# Patient Record
Sex: Female | Born: 1995 | Race: White | Hispanic: No | Marital: Single | Smoking: Never smoker
Health system: Southern US, Community
[De-identification: ages and names within clinical notes are randomized; demographics above are authoritative.]

## PROBLEM LIST (undated history)

## (undated) DIAGNOSIS — R002 Palpitations: Secondary | ICD-10-CM

## (undated) DIAGNOSIS — M954 Acquired deformity of chest and rib: Secondary | ICD-10-CM

## (undated) DIAGNOSIS — I491 Atrial premature depolarization: Secondary | ICD-10-CM

## (undated) DIAGNOSIS — I493 Ventricular premature depolarization: Secondary | ICD-10-CM

## (undated) DIAGNOSIS — R55 Syncope and collapse: Secondary | ICD-10-CM

## (undated) HISTORY — PX: NO PAST SURGERIES: SHX2092

## (undated) HISTORY — DX: Acquired deformity of chest and rib: M95.4

## (undated) HISTORY — DX: Palpitations: R00.2

## (undated) HISTORY — DX: Atrial premature depolarization: I49.1

## (undated) HISTORY — DX: Ventricular premature depolarization: I49.3

## (undated) HISTORY — DX: Syncope and collapse: R55

---
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
## 2014-02-12 ENCOUNTER — Ambulatory Visit (INDEPENDENT_AMBULATORY_CARE_PROVIDER_SITE_OTHER): Payer: BC Managed Care – PPO | Admitting: Cardiovascular Disease

## 2014-02-12 ENCOUNTER — Encounter: Payer: Self-pay | Admitting: Cardiovascular Disease

## 2014-02-12 ENCOUNTER — Telehealth: Payer: Self-pay

## 2014-02-12 ENCOUNTER — Encounter (INDEPENDENT_AMBULATORY_CARE_PROVIDER_SITE_OTHER): Payer: Self-pay

## 2014-02-12 VITALS — BP 98/78 | HR 74 | Ht 67.0 in | Wt 137.5 lb

## 2014-02-12 DIAGNOSIS — R0602 Shortness of breath: Secondary | ICD-10-CM | POA: Insufficient documentation

## 2014-02-12 DIAGNOSIS — R110 Nausea: Secondary | ICD-10-CM

## 2014-02-12 DIAGNOSIS — I471 Supraventricular tachycardia: Secondary | ICD-10-CM

## 2014-02-12 DIAGNOSIS — R002 Palpitations: Secondary | ICD-10-CM | POA: Insufficient documentation

## 2014-02-12 DIAGNOSIS — I493 Ventricular premature depolarization: Secondary | ICD-10-CM

## 2014-02-12 DIAGNOSIS — Z87898 Personal history of other specified conditions: Secondary | ICD-10-CM

## 2014-02-12 DIAGNOSIS — Z9189 Other specified personal risk factors, not elsewhere classified: Secondary | ICD-10-CM

## 2014-02-12 DIAGNOSIS — I491 Atrial premature depolarization: Secondary | ICD-10-CM

## 2014-02-12 NOTE — Patient Instructions (Addendum)
You are doing well. No medication changes were made.  We will order a 30 day monitor for arrhythmia  Your physician has recommended that you wear an event monitor. Event monitors are medical devices that record the heart's electrical activity. Doctors most often us these monitors to diagnose arrhythmias. Arrhythmias are problems with the speed or rhythm of the heartbeat. The monitor is a small, portable device. You can wear one while you do your normal daily activities. This is usually used to diagnose what is causing palpitations/syncope (passing out).  Please call us if you have new issues that need to be addressed before your next appt.  Your physician wants you to follow-up in: 6 weeks  Your next appointment will be scheduled in our new office located at :  [facility]  [address], [address]  [place], [place] [id number]

## 2014-02-12 NOTE — Assessment & Plan Note (Signed)
Prior Holter monitor at [facility] suggested she had slow atrial tachycardia with rates up to 100. Event monitor has been ordered for further evaluation

## 2014-02-12 NOTE — Assessment & Plan Note (Signed)
Frequent APCs noted on prior Holter monitor. Uncertain if this is what she is appreciating. Repeat event monitor ordered

## 2014-02-12 NOTE — Telephone Encounter (Signed)
Pt father was asking about getting a rx for a BP cuff, for flex spending reasons, also states she has a class at 4:00 today and if she can get it today and take it back with her would be ideal. Please call.

## 2014-02-12 NOTE — Assessment & Plan Note (Signed)
No recent episodes in the past several years, likely secondary to vasovagal syncope or orthostasis. Recommended she stay hydrated, increase her salt intake, try to avoid losing weight

## 2014-02-12 NOTE — Telephone Encounter (Signed)
Rx left at front desk for pt to pick up at her convenience.   Pt's father is aware and states they will stop by after they finish lunch.

## 2014-02-12 NOTE — Assessment & Plan Note (Signed)
Patient and father report irregular rhythm. Prior Holter showing APCs. Symptoms seem to be worse now than they were last October 2014. We have recommended she wear a 30 day event monitor. Symptoms do not happen daily and Holter monitor might miss her arrhythmia.

## 2014-02-12 NOTE — Progress Notes (Signed)
Patient ID: Yolanda Kim, female    DOB: 1995/12/21, 18 y.o.   MRN: [id number]  HPI Comments: Mrs. Yolanda LoudCaroline Kernsis a very pleasant 18 year old woman, sophomore in college, presents for evaluation of palpitations, tachycardia, nausea, shortness of breath. Previously seen by [facility]ology as recently as last year. These notes were attained and thoroughly reviewed. Prior Holter monitor was not available but results were reviewed.  She reports that she had numerous episodes of syncope when she was in high school. This occurred approximately once per year typically in the setting of exertion or being in the heat. Typically blood pressure runs very low. She attributes the syncope 2 drops in her blood pressure and not drinking enough fluids. She's not had syncope in quite some time. She had a Holter for 24 hours back in 02/05/13 which revealed premature atrial contractions that occur as singlets, couplets and runs. The runs consisted of a  atrial  tachycardia with a fastest rate of 100 bpm. The longest run was 8 beats. PAC's occurred 25% of the time.(these details obtained from note from [facility]ology)  More recently started last year she has had worsening episodes of palpitations, tachycardia, sometimes associated with nausea and shortness of breath. Symptoms are worse when drinking alcohol, as well as coffee. She does appreciate palpitations, sometimes strong heartbeat in the evenings when she is trying to sleep. Symptoms last for hours. In the mornings, worse after drinking alcohol the night before, she will wake with malaise, nausea, shortness of breath, palpitations and tachycardia. Typically it will take some time to resolve.  Husband works for the [employer] and has previously helped obtain EKGs and has monitored her heart rate when she has episodes. Heart rate is typically very irregular. Results of prior EKGs are not available   today in [facility] for a return  patient evaluation. She is a 18 y.o. female who was seen in our office 02/05/13 for these complaints. A Holter monitor was placed at that time. This Holter monitor revealed premature atrial contractions and an ectopic atrial rhythm. Yolanda Kim reports that she has palpitations twice a week. She reports that she these occur at rest. She reports elevated heart rates. When asked to indicate a heart rate she indicates a heart rate of 100-110 bpm. She does notice these episodes more when she has had caffeine.  shereports normal exercise tolerance when not having palpitations. She denies chest pain, and cyanosis.   Past Medical History: . Palpitations  . Syncope  . PAC (premature atrial contraction) seen on Holter monitor Atrial tachycardia on Holter monitor   Family Medical History: . Congenital heart disease Neg Hx  . SIDS Neg Hx  . Sudden death Neg Hx  . Arrhythmia, atrial fibrillation Father  . Arrhythmia Paternal Uncle  No coronary artery disease  Social History  Freshman at [employer]. Originally from [place]. Denies smoking. Occasional alcohol  EKG done in the office today shows normal sinus rhythm with rate 74 bpm, no significant ST or T-wave changes     Outpatient Encounter Prescriptions as of 02/12/2014  Medication Sig  . fexofenadine (ALLEGRA) 180 MG tablet Take 180 mg by mouth daily.   . fluticasone (FLONASE) 50 MCG/ACT nasal spray Place 1 spray into the nose daily.     Review of Systems  Constitutional: Negative.   HENT: Negative.   Eyes: Negative.   Respiratory: Positive for shortness of breath.   Cardiovascular: Positive for palpitations.       Tachycardia  Gastrointestinal: Positive  for nausea.  Endocrine: Negative.   Musculoskeletal: Negative.   Skin: Negative.   Allergic/Immunologic: Negative.   Neurological: Negative.   Hematological: Negative.   Psychiatric/Behavioral: Negative.   All other systems reviewed and are negative.   BP 98/78 mmHg  Pulse 74  Ht  5\' 7"  (1.702 m)  Wt 137 lb 8 oz (62.37 kg)  BMI 21.53 kg/m2  Physical Exam  Constitutional: She is oriented to person, place, and time. She appears well-developed and well-nourished.  HENT:  Head: Normocephalic.  Nose: Nose normal.  Mouth/Throat: Oropharynx is clear and moist.  Eyes: Conjunctivae are normal. Pupils are equal, round, and reactive to light.  Neck: Normal range of motion. Neck supple. No JVD present.  Cardiovascular: Normal rate, regular rhythm, S1 normal, S2 normal, normal heart sounds and intact distal pulses.  Exam reveals no gallop and no friction rub.   No murmur heard. Pulmonary/Chest: Effort normal and breath sounds normal. No respiratory distress. She has no wheezes. She has no rales. She exhibits no tenderness.  Abdominal: Soft. Bowel sounds are normal. She exhibits no distension. There is no tenderness.  Musculoskeletal: Normal range of motion. She exhibits no edema or tenderness.  Lymphadenopathy:    She has no cervical adenopathy.  Neurological: She is alert and oriented to person, place, and time. Coordination normal.  Skin: Skin is warm and dry. No rash noted. No erythema.  Psychiatric: She has a normal mood and affect. Her behavior is normal. Judgment and thought content normal.    Assessment and Plan  Nursing note and vitals reviewed.

## 2014-02-12 NOTE — Assessment & Plan Note (Signed)
Etiology is not clear, possibly from tachycardia.event monitor ordered

## 2014-02-12 NOTE — Assessment & Plan Note (Signed)
I suspect her nausea could be secondary to hypotension. She does have baseline low blood pressure, prior syncope. Recommended she buy a blood pressure cuff and closely monitor her blood pressure when she has episodes

## 2014-02-21 ENCOUNTER — Telehealth: Payer: Self-pay | Admitting: Cardiovascular Disease

## 2014-02-21 NOTE — Telephone Encounter (Signed)
Left message for pt's father that they should receive a call from [employer] today.  Asked them to call back if he has any questions or has not heard from them by tomorrow.

## 2014-02-21 NOTE — Telephone Encounter (Signed)
New  Problem   Pt's dad is calling about he hasn't received pt's holter monitor. Please call.

## 2014-02-27 DIAGNOSIS — R002 Palpitations: Secondary | ICD-10-CM

## 2014-03-04 ENCOUNTER — Telehealth: Payer: Self-pay | Admitting: *Deleted

## 2014-03-04 NOTE — Telephone Encounter (Signed)
Yolanda Kim with [employer] monitoring called and stated patient heart rate is 160 Sinus Tach  Sustained 1 minute  They attempted to call patient response  Heart rate was auto detected not activated by patient   Spoke with patient  She stated she was walking to class  She also stated that she did not feel anything abnormal and feels fine now  [employer] to fax strip

## 2014-03-04 NOTE — Telephone Encounter (Signed)
Would see if we can print have the strip to evaluate

## 2014-03-05 NOTE — Telephone Encounter (Signed)
[employer] report printed for Dr. Windell HummingbirdGollan's review.

## 2014-03-19 ENCOUNTER — Telehealth: Payer: Self-pay

## 2014-03-19 NOTE — Telephone Encounter (Signed)
Received call about serious EKG on pt.  Printed report showed HR of 163 at 12/16 pm on 03/18/14. Spoke w/ pt's mother.   She states that she is unaware of pt's activity at that time, but that pt is "in the middle of exams". Asked her to have pt call us back.

## 2014-03-21 NOTE — Telephone Encounter (Signed)
Left message for pt to call back  °

## 2014-03-21 NOTE — Telephone Encounter (Signed)
Would schedule new patient visit with Dr. Graciela HusbandsKlein Rhythm appears to be atrial tachycardia, unable to exclude reentrant rhythm Rates documented up to 170 bpm at rest Unclear if she would benefit from medications versus ablation if a candidate

## 2014-03-21 NOTE — Telephone Encounter (Signed)
[employer] called w/ serious EKG on pt.  HR 173 sustained for 1 min.  Printed report for Dr. Windell HummingbirdGollan's review.

## 2014-03-21 NOTE — Telephone Encounter (Signed)
Spoke w/ pt.  She reports that she was sitting in bed studying when [employer] called to check on her around 11:45. Reports that she was asymptomatic at the time, but felt that her heart was racing at 12:45 while walking to her exam. She denies any other symptoms.

## 2014-03-21 NOTE — Telephone Encounter (Signed)
Received call from [employer] w/ 2nd serious EKG today.  HR 164-167 for full min.  They have left message for pt to call back. Printed reports for Dr. Windell HummingbirdGollan's review.

## 2014-03-22 ENCOUNTER — Telehealth: Payer: Self-pay

## 2014-03-22 NOTE — Telephone Encounter (Signed)
Spoke w/ pt's father. Advised him that pt will not need to wear monitor an additional 30 days, but will wear until 12/18 and keep her appt w/ Dr. Graciela HusbandsKlein in Jan.  He verbalizes understanding and will call back w/ any questions or concerns.

## 2014-03-22 NOTE — Telephone Encounter (Signed)
Spoke w/ pt.  Advised her of Dr. Windell HummingbirdGollan's recommendation.  She is agreeable and is sched to see Dr. Graciela HusbandsKlein 05/10/14 @ 9:00.

## 2014-03-22 NOTE — Telephone Encounter (Signed)
Pt dad called, wanted to know if pt had to wear monitor another 30 days, (until the end of January). Please call and advise

## 2014-03-28 ENCOUNTER — Encounter: Payer: Self-pay | Admitting: *Deleted

## 2014-03-28 NOTE — Telephone Encounter (Signed)
Please call patient's father regarding the holter monitor and when does she need to see Dr. Mariah MillingGollan?

## 2014-03-29 ENCOUNTER — Ambulatory Visit: Payer: BC Managed Care – PPO | Admitting: Cardiovascular Disease

## 2014-03-29 NOTE — Telephone Encounter (Signed)
This encounter was created in error - please disregard.

## 2014-03-29 NOTE — Telephone Encounter (Signed)
Pt is sched to remove 30 day monitor today.  She is sched to see Dr. Graciela HusbandsKlein 05/07/13. Does she need to follow up with you?

## 2014-04-17 ENCOUNTER — Ambulatory Visit (INDEPENDENT_AMBULATORY_CARE_PROVIDER_SITE_OTHER): Payer: Self-pay

## 2014-04-17 ENCOUNTER — Other Ambulatory Visit: Payer: Self-pay

## 2014-04-17 DIAGNOSIS — I471 Supraventricular tachycardia: Secondary | ICD-10-CM

## 2014-04-17 DIAGNOSIS — I491 Atrial premature depolarization: Secondary | ICD-10-CM

## 2014-04-17 DIAGNOSIS — R002 Palpitations: Secondary | ICD-10-CM

## 2014-04-17 DIAGNOSIS — R0602 Shortness of breath: Secondary | ICD-10-CM

## 2014-04-17 DIAGNOSIS — R110 Nausea: Secondary | ICD-10-CM

## 2014-05-07 ENCOUNTER — Ambulatory Visit: Payer: BC Managed Care – PPO | Admitting: Internal Medicine

## 2014-06-21 ENCOUNTER — Encounter: Payer: Self-pay | Admitting: Internal Medicine

## 2014-06-21 ENCOUNTER — Ambulatory Visit (INDEPENDENT_AMBULATORY_CARE_PROVIDER_SITE_OTHER): Payer: BLUE CROSS/BLUE SHIELD | Admitting: Internal Medicine

## 2014-06-21 VITALS — BP 111/68 | HR 68 | Ht 67.0 in | Wt 137.0 lb

## 2014-06-21 DIAGNOSIS — I471 Supraventricular tachycardia: Secondary | ICD-10-CM

## 2014-06-21 NOTE — Progress Notes (Signed)
ELECTROPHYSIOLOGY CONSULT NOTE  Patient ID: Yolanda Kim, MRN: [id number], DOB/AGE: Sep 05, 1995 19 y.o. Admit date: (Not on file) Date of Consult: 06/21/2014  Primary Physician: Chrys Racer, MD Primary Cardiologist: TG  Chief Complaint: syncope and palpitations   HPI Yolanda Kim is a 19 y.o. female  sophomore at [employer] was referred because of an abnormal event recorder.  She was a Horticulturist, commercial in high school and had episodes of recurrent stereo typical syncope following exertion particularly when she exposed to high ambient temperature. There is a stereotypical prodrome characterized by tinnitus, blurry vision, some diaphoresis and some nausea with residual orthostatic intolerance and considerable fatigue. She has had no episodes in the last 2 years. Over the last 18 months however, she has had a different type of spell characterized by significant nausea accompanied also by lightheadedness and recognizable pallor from her father who is a IT sales professional. We don't have vital sign information from this. It is also associated with profound residual fatigue. In her mindThese episodes are somewhat similar.  She does not exercise because of symptoms of lightheadedness and weakness. She does not have these symptoms so much during her activities of daily living. She denies shower intolerance. 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  Her diet is replete of fluid but deplete of sodium  She had an abnormal Holter somewhere along the line and was seen by [facility] cardiology was concerned about her PACs. She has not had an echocardiogram. These notes were reviewed     Past Medical History  Diagnosis Date  . Syncope and collapse   . PVC's (premature ventricular contractions)   . 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 PAC (premature atrial contraction)   . Palpitations   . Vasovagal syncope       Surgical History: History reviewed. No pertinent past  surgical history.   Home Meds: Prior to Admission medications   Medication Sig Start Date End Date Taking? Authorizing Provider  fexofenadine (ALLEGRA) 180 MG tablet Take 180 mg by mouth daily.    Yes Historical Provider, MD  fluticasone (FLONASE) 50 MCG/ACT nasal spray Place 1 spray into the nose daily.    Yes Historical Provider, MD      Allergies:  Allergies  Allergen Reactions  . Cephalosporins Other (See Comments)    History   Social History  . Marital Status: Single    Spouse Name: N/A  . Number of Children: N/A  . Years of Education: N/A   Occupational History  . Not on file.   Social History Main Topics  . Smoking status: Never Smoker   . Smokeless tobacco: Not on file  . Alcohol Use: Yes     Comment: socially.  . Drug Use: No  . Sexual Activity: Not on file   Other Topics Concern  . Not on file   Social History Narrative     Family History  Problem Relation Age of Onset  . Arrhythmia Father     A-Fib  . Hypertension Father   . Hyperlipidemia Father      ROS:  Please see the history of present illness.     All other systems reviewed and negative.    Physical Exam:   Blood pressure 111/68, pulse 68, height  (1.702 m), weight 137 lb (62.143 kg). General: Well developed, well nourished female in no acute distress. Head: Normocephalic, atraumatic, sclera non-icteric, no xanthomas, nares are without discharge. EENT: normal Lymph  Nodes:  none Back: without scoliosis/kyphosis, no CVA tendersness Neck: Negative for carotid bruits. JVD not elevated. Lungs: Clear bilaterally to auscultation without wheezes, rales, or rhonchi. Breathing is unlabored. Heart: RRR with S1 S2. No murmur , rubs, or gallops appreciated. Abdomen: Soft, non-tender, non-distended with normoactive bowel sounds. No hepatomegaly. No rebound/guarding. No obvious abdominal masses. Msk:  Strength and tone appear normal for age. Extremities: No clubbing or cyanosis. No edema.  Distal  pedal pulses are 2+ and equal bilaterally. Skin: Warm and Dry Neuro: Alert and oriented X 3. CN III-XII intact Grossly normal sensory and motor function . Psych:  Responds to questions appropriately with a normal affect.      Labs: Cardiac Enzymes No results for input(s): CKTOTAL, CKMB, TROPONINI in the last 72 hours. CBC No results found for: WBC, HGB, HCT, MCV, PLT PROTIME: No results for input(s): LABPROT, INR in the last 72 hours. Chemistry No results for input(s): NA, K, CL, CO2, BUN, CREATININE, CALCIUM, PROT, BILITOT, ALKPHOS, ALT, AST, GLUCOSE in the last 168 hours.  Invalid input(s): LABALBU Lipids No results found for: CHOL, HDL, LDLCALC, TRIG BNP No results found for: PROBNP Miscellaneous No results found for: DDIMER  Radiology/Studies:  No results found.  EKG:  Sinus rhythm at 68 Intervals 01/17/37  Event recorder was reviewed and demonstrated sinus tachycardia   Assessment and Plan:   Dysautonomia  She has had a long-standing history of syncope occurring in the post exercise phase particularly with high ambient temperatures consistent with neurally mediated syncope. The more recent episodes are less clearly that have epiphenomena to suggest that in fact his dysautonomic issue as suggested by the fact that they are stimulated by caffeine, aggravated by ambient heat. Associated with some degree of exercise intolerance and are associated with pallor as well as nausea. The fact that they are somewhat atypical give me some cause. She also has evidence of orthostatic hypotension as demonstrated by her heart rate rise of 30+ beats today. This again supports the diagnosis.  We have discussed extensively the physiology of dysautonomia. I have given her the NDRF.org website. I've advised her to increase her salt replacement with the use of ThermaTabs 2 tablets twice daily. We will review the situation in about 3 months   Sherryl MangesSteven Vern Guerette

## 2014-06-21 NOTE — Patient Instructions (Signed)
Your physician recommends that you continue on your current medications as directed. Please refer to the Current Medication list given to you today.  Your physician has requested that you have an echocardiogram in [place]. Echocardiography is a painless test that uses sound waves to create images of your heart. It provides your doctor with information about the size and shape of your heart and how well your heart's chambers and valves are working. This procedure takes approximately one hour. There are no restrictions for this procedure.  Your physician recommends that you schedule a follow-up appointment in: 4 months with Dr. Graciela HusbandsKlein in [place]

## 2014-06-24 IMAGING — US TRANSABDOMINAL ULTRASOUND OF PELVIS
1 series · 14 of 25 positions shown · non-contrast
Comparison: none

REASON FOR EXAM: CALL REPORT   [DATE]  No transvaginal Pelvic Pain RUQ
abd pain Eval Appendix
COMMENTS:

[Series 1: transabdominal ultrasound of pelvis · 0.18mm/px · 14 of 45 slices shown]
[im 1/45]
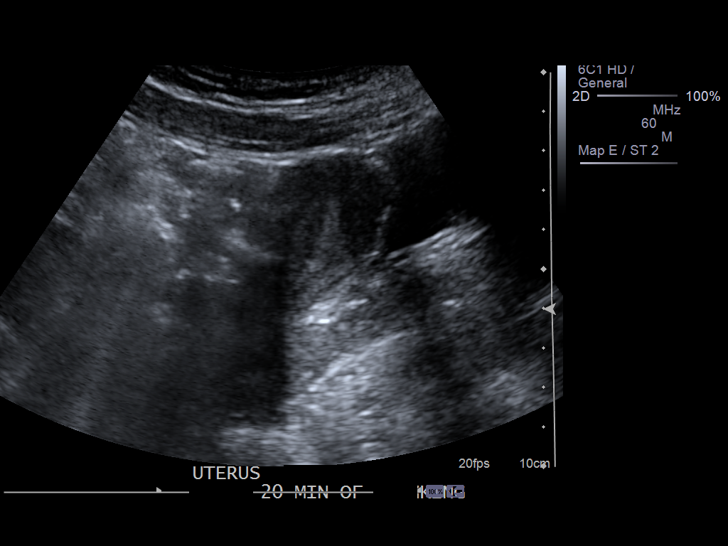
[im 4/45]
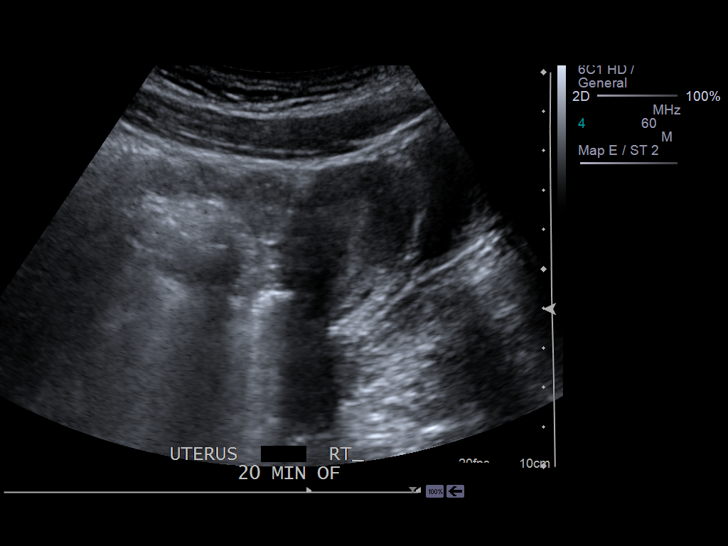
[im 8/45]
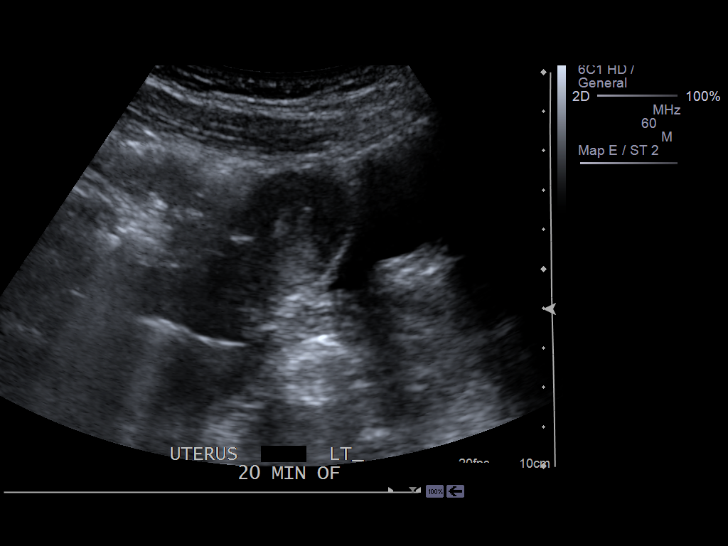
[im 12/45]
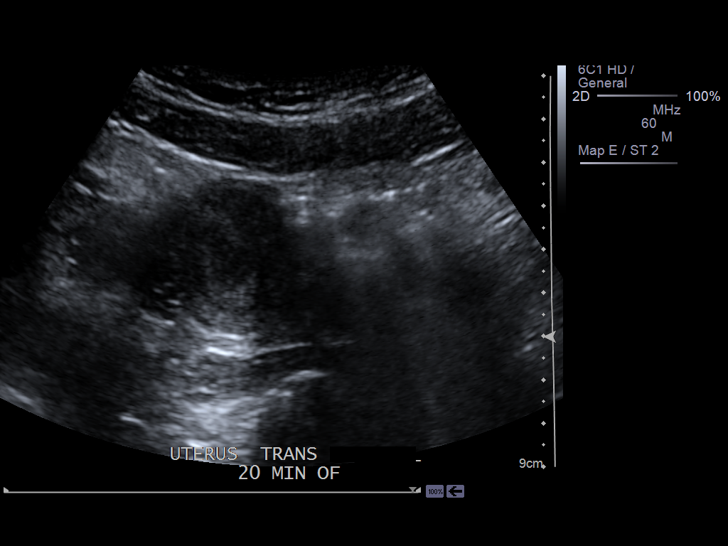
[im 15/45]
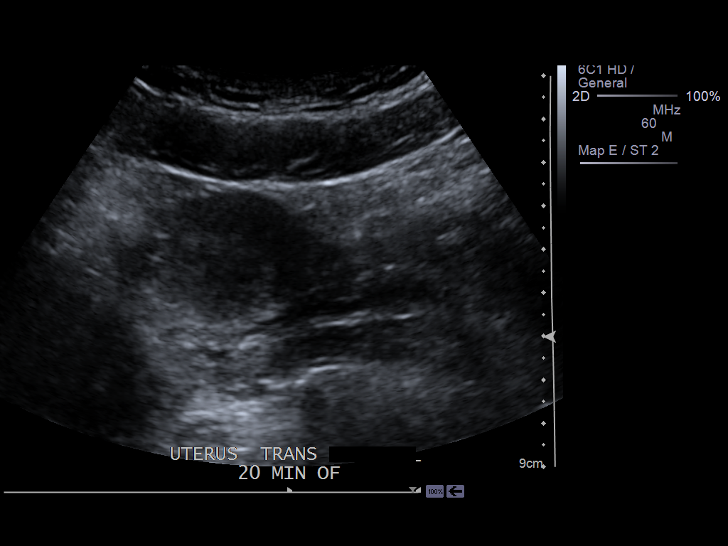
[im 17/45]
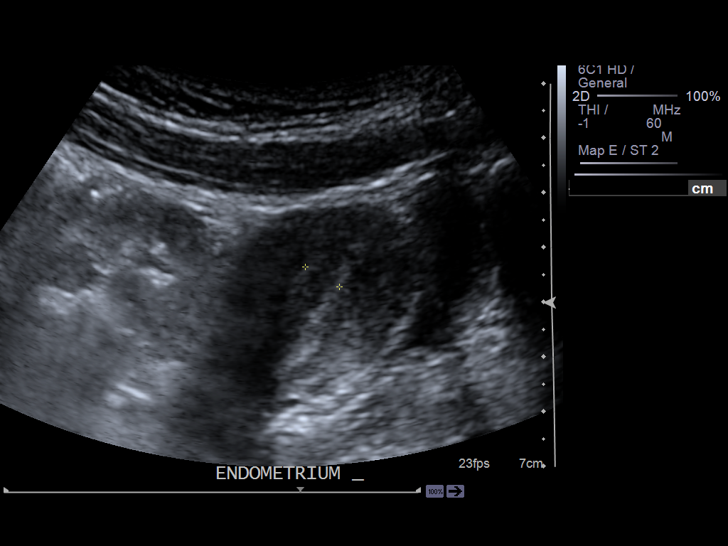
[im 21/45]
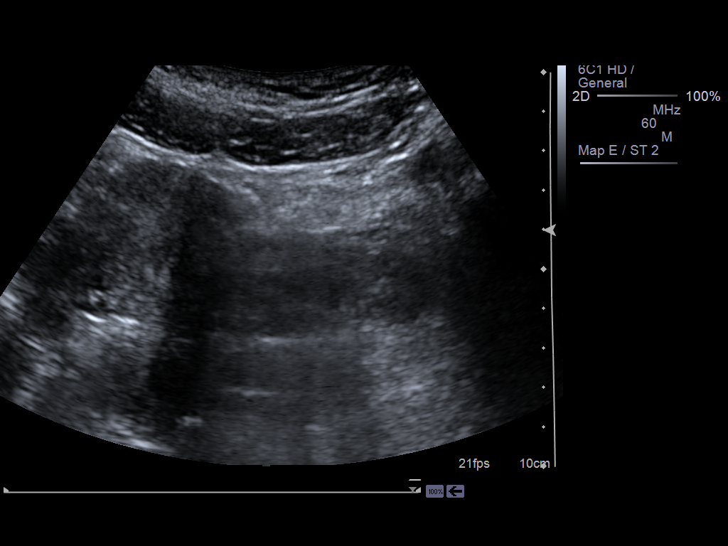
[im 24/45]
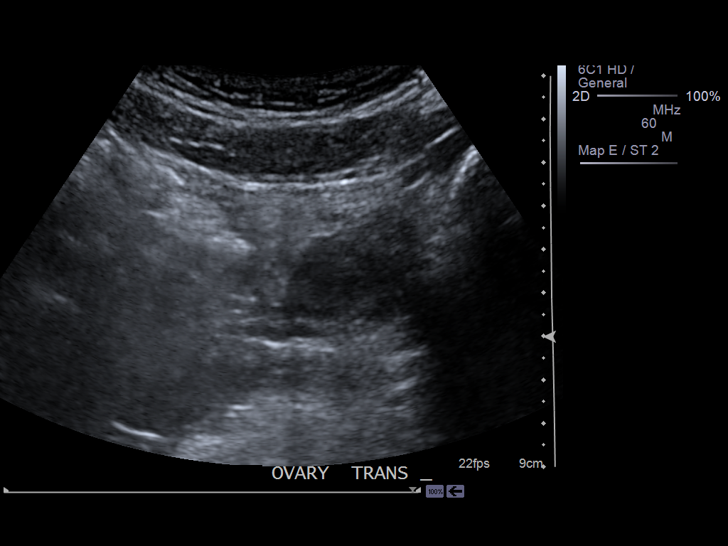
[im 28/45]
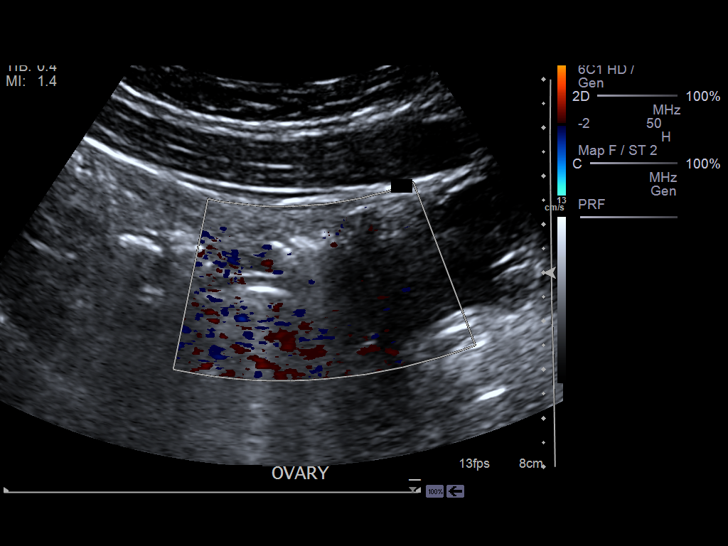
[im 30/45]
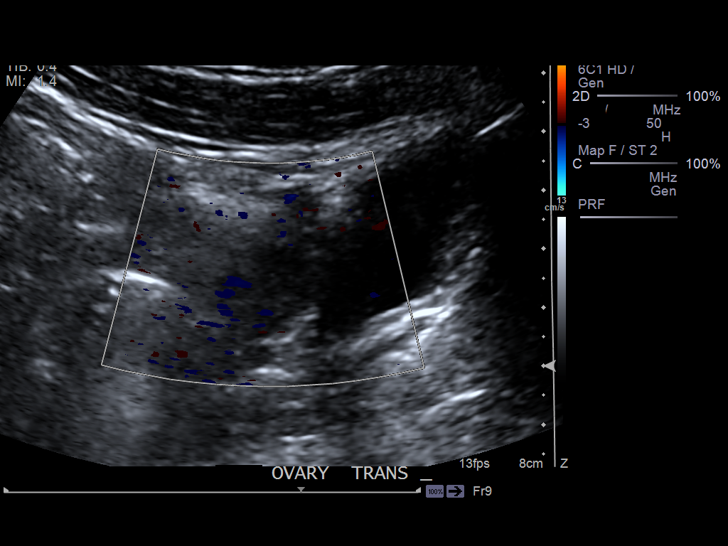
[im 34/45]
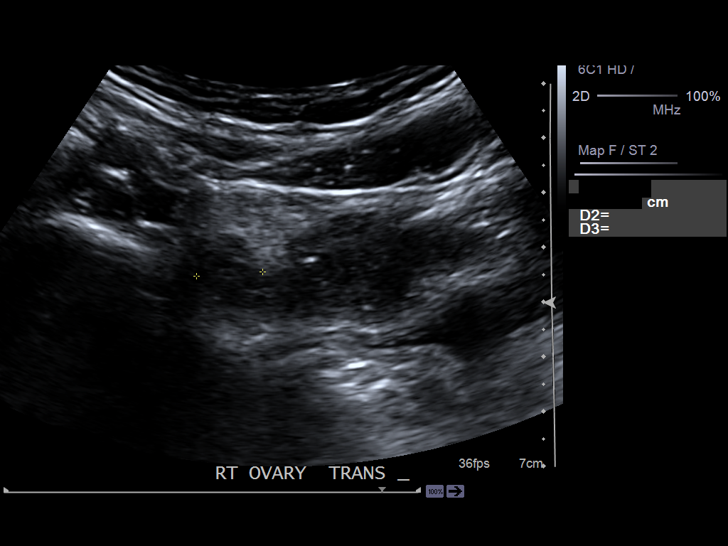
[im 37/45]
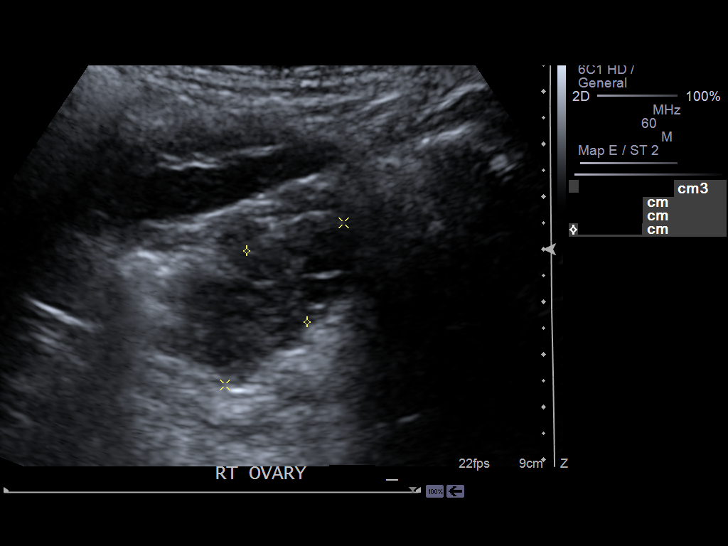
[im 41/45]
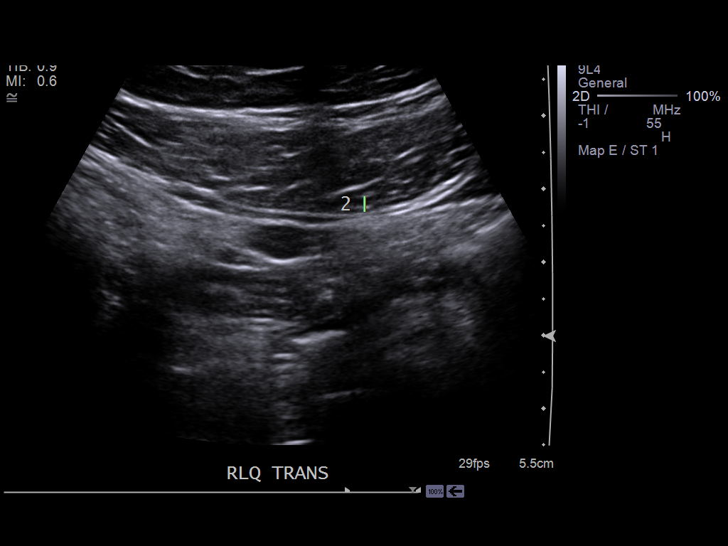
[im 45/45]
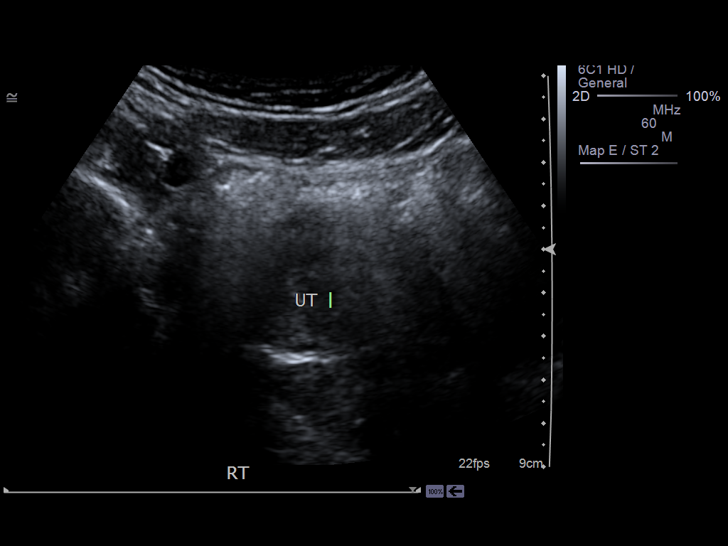

[14 of 25 positions shown; findings below may reference images not displayed]

PROCEDURE:     KLPIGBB - KLPIGBB PELVIS NON-OB  - July 19, 2012 [DATE]

RESULT:     The pelvis was evaluated with trans- abdominal imaging
techniques only.

The uterus is normal in echotexture and measures 6.7 x 4 x 2.6 cm. The
endometrial stripe measures just under 8 millimeters in thickness. The left
ovary measures 1.9 x 2.0 x 1.7 centimeters. The right ovary measures 1.2 x
3.8 x 1.8 cm. Vascularity of the ovaries is normal.
IMPRESSION: Normal pelvic ultrasound examination.

[REDACTED]

## 2014-06-24 IMAGING — US US ABDOMEN LIMITED SLG ORGAN/ASCITES
1 series · 13 of 13 positions shown · non-contrast
Comparison: none

REASON FOR EXAM: CALL REPORT   [DATE]  No transvaginal Pelvic Pain RUQ
abd pain Eval Appendix
COMMENTS:

[Series 1: us abdomen limited slg organ/ascites · 0.11mm/px · 13 of 13 slices shown]
[im 1/13]
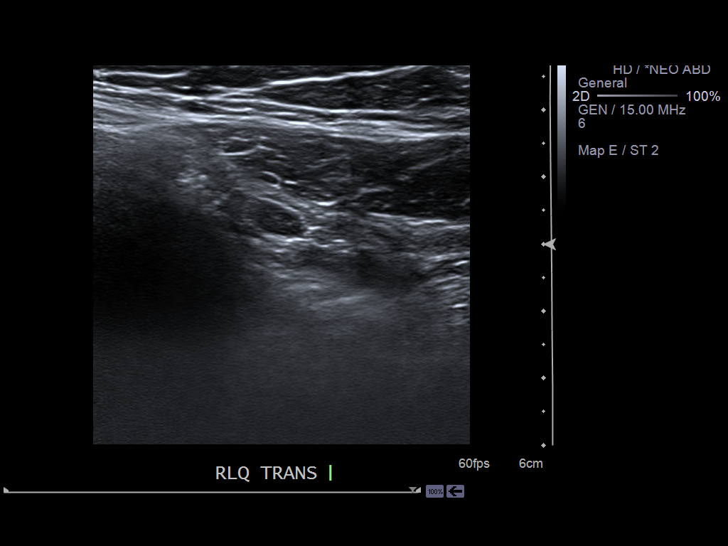
[im 2/13]
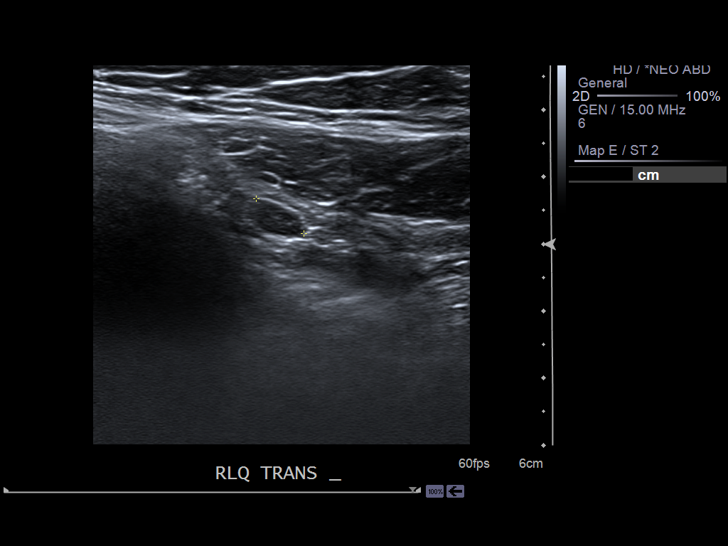
[im 3/13]
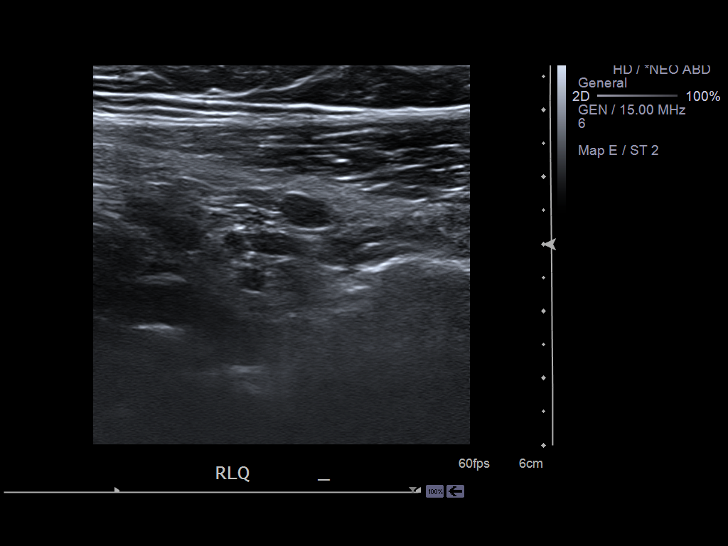
[im 4/13]
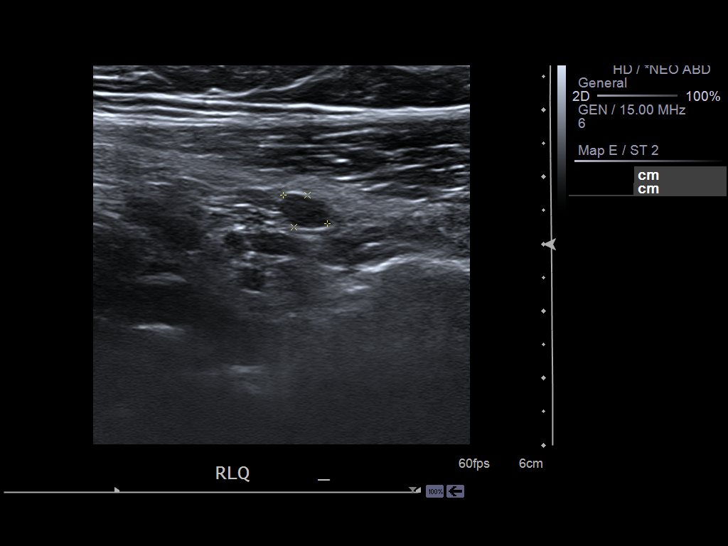
[im 5/13]
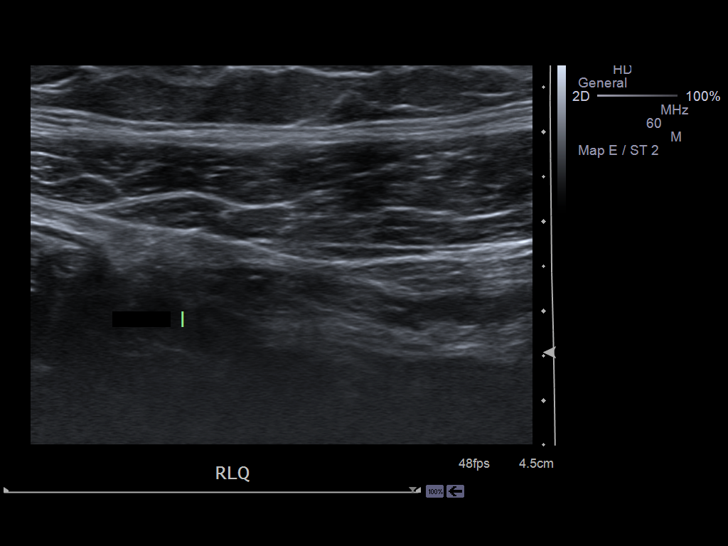
[im 6/13]
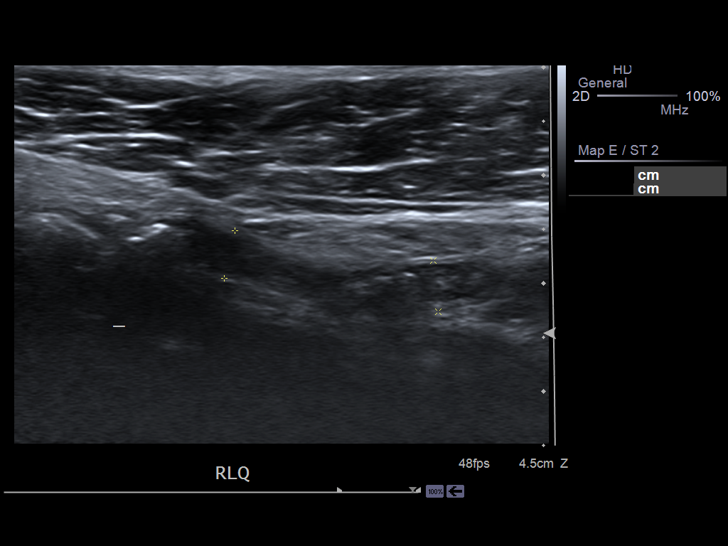
[im 7/13]
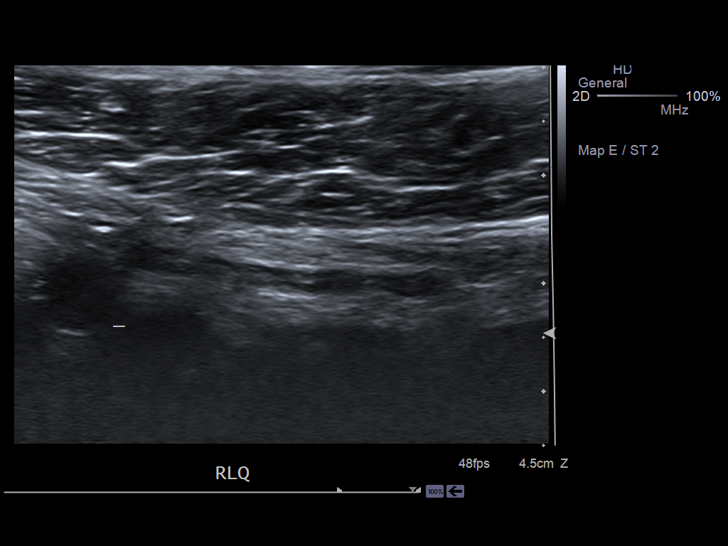
[im 8/13]
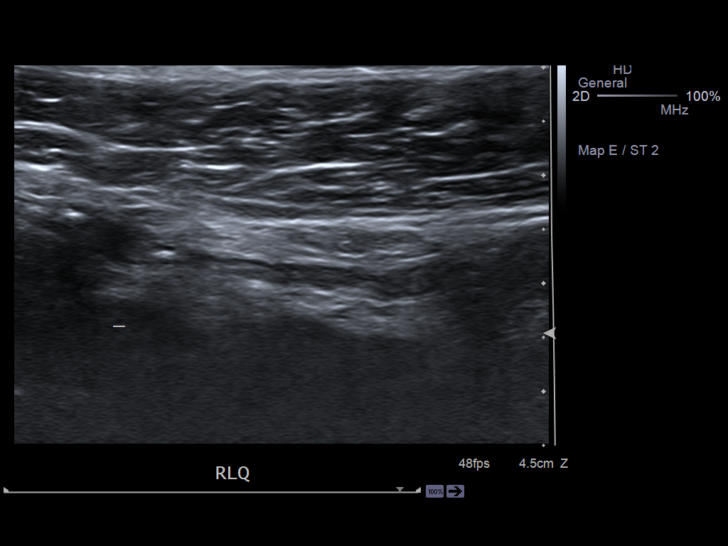
[im 9/13]
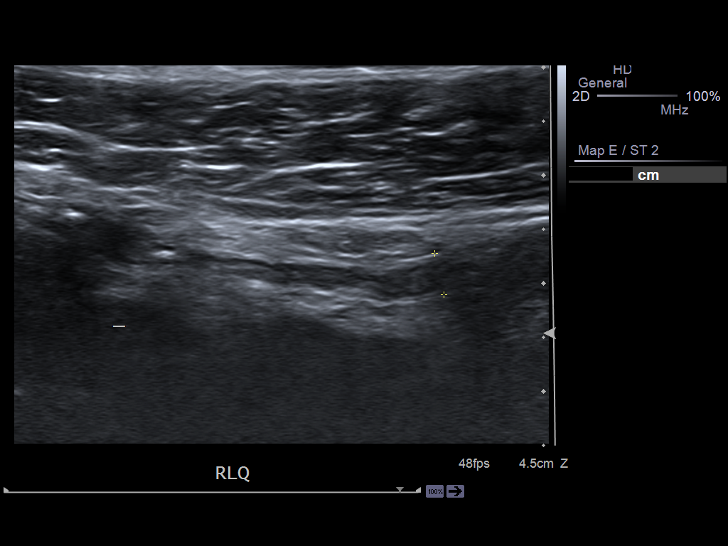
[im 10/13]
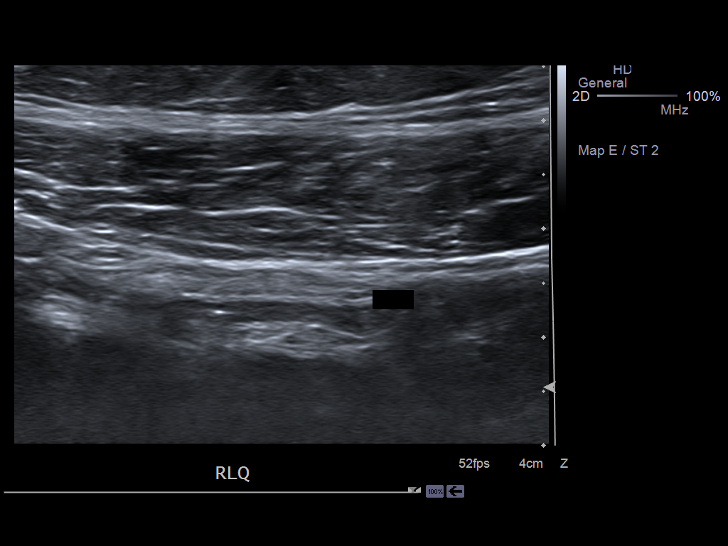
[im 11/13]
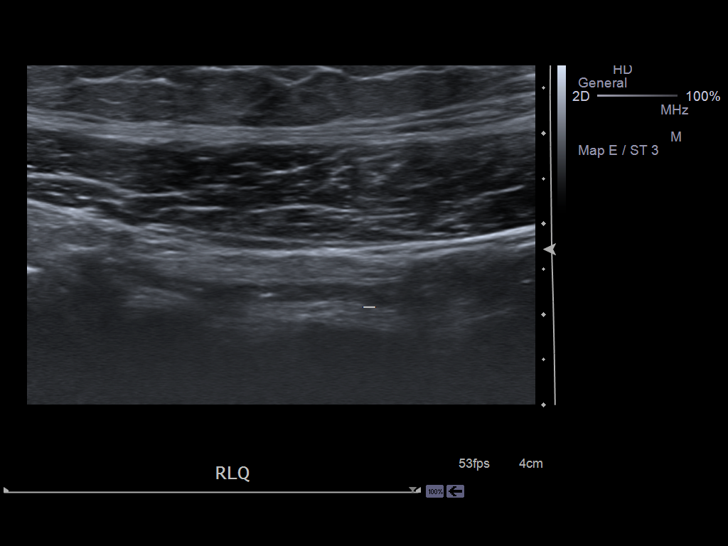
[im 12/13]
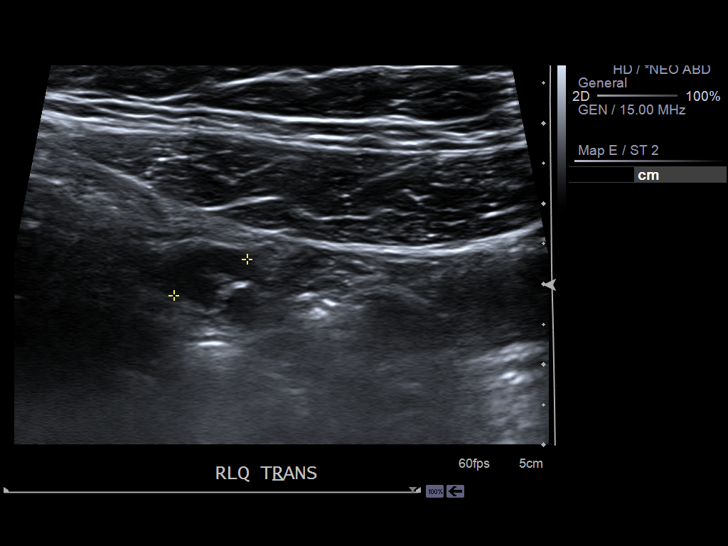
[im 13/13]
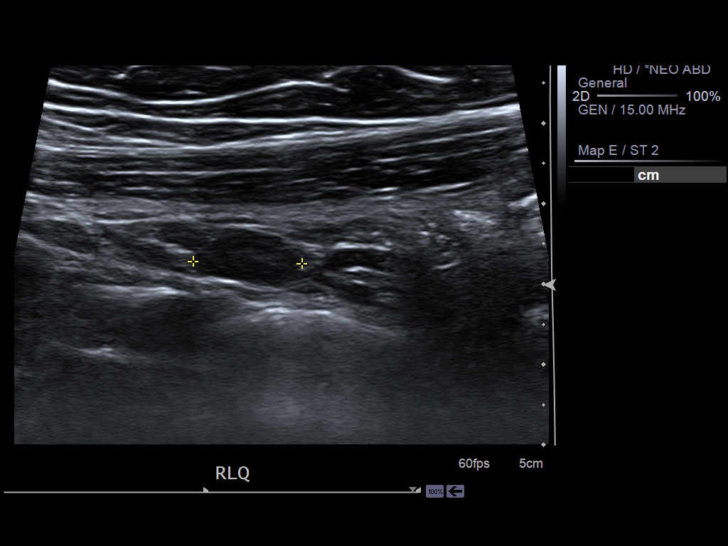

[13 of 13 positions shown; findings below may reference images not displayed]

PROCEDURE:     BOKOLO - BOKOLO ABDOMEN LTD 1 ORGAN OR QUAD  - July 19, 2012 [DATE]

RESULT:     Doppler and compression graded ultrasound was performed of the
right lower quadrant of the abdomen. No discrete normal or abnormal appendix
is demonstrated. There are mildly prominent lymph nodes present here.
IMPRESSION: No discrete normal or abnormal appendix is demonstrated. A
few lymph nodes are noted. If there are persistent clinical concerns of
acute appendicitis, CT scanning would be the most useful next step.

[REDACTED]

## 2014-06-24 IMAGING — CT CT ABD-PELV W/ CM
1 of 2 series · 15 of 32 positions shown, 19 images · IV contrast (isovue)
Comparison: none

REASON FOR EXAM: CALL REPORT   [DATE]  No transvaginal Pelvic Pain RUQ
abd pain Eval Appendix
COMMENTS:

PROCEDURE:     CT  - CT ABDOMEN / PELVIS  W  - July 19, 2012  [DATE]
RESULT:     Comparison:  None
TECHNIQUE: Multiple axial images of the abdomen and pelvis were performed
from the lung bases to the pubic symphysis, with p.o. contrast and with 85
mL of Isovue 300 intravenous contrast.

[Series 2: 3mm soft tissue · axial · 0.62mm/px · z∈[-474,-60]mm · 15 of 151 slices shown, 19 images]
[im 7/151  soft-tissue]
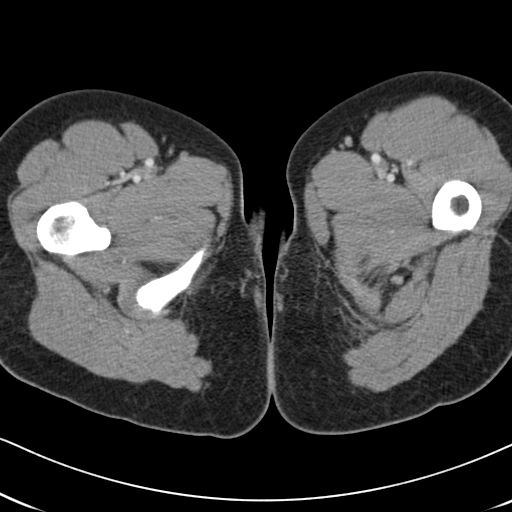
[im 7/151  bone]
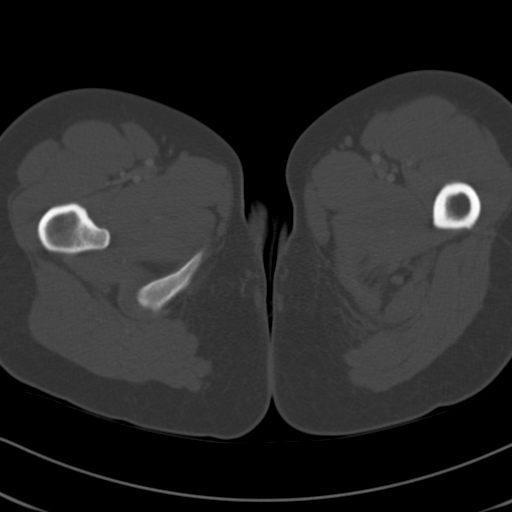
[im 19/151  soft-tissue]
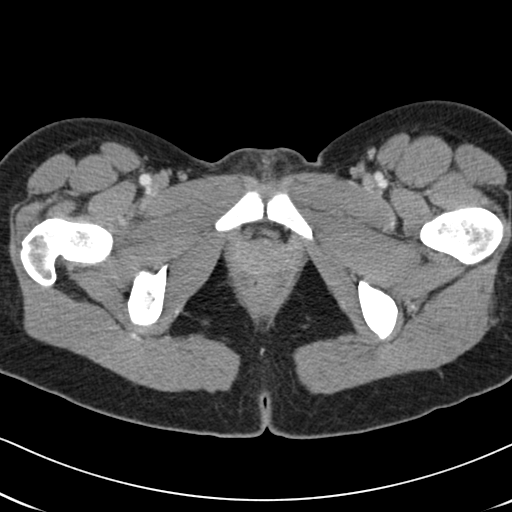
[im 31/151  soft-tissue]
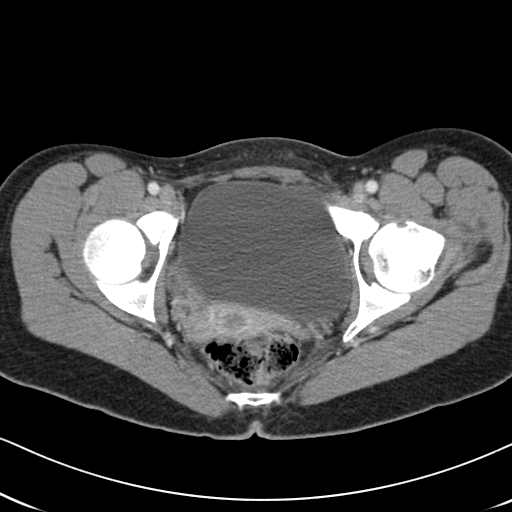
[im 43/151  soft-tissue]
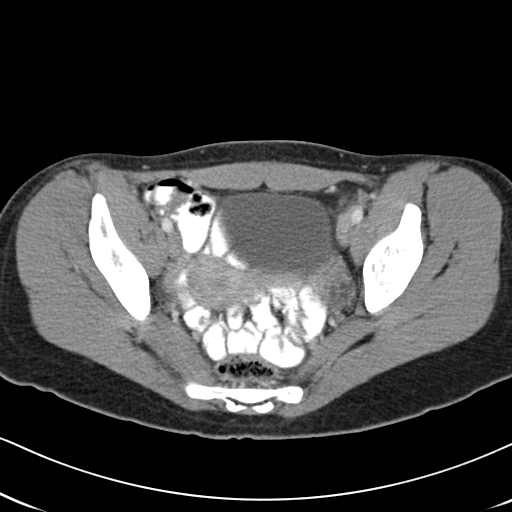
[im 55/151  soft-tissue]
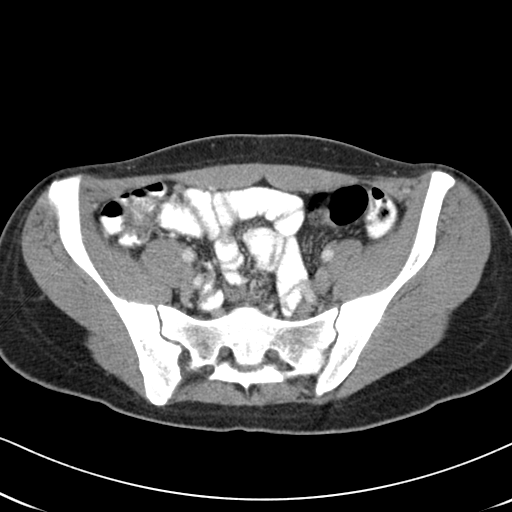
[im 67/151  soft-tissue]
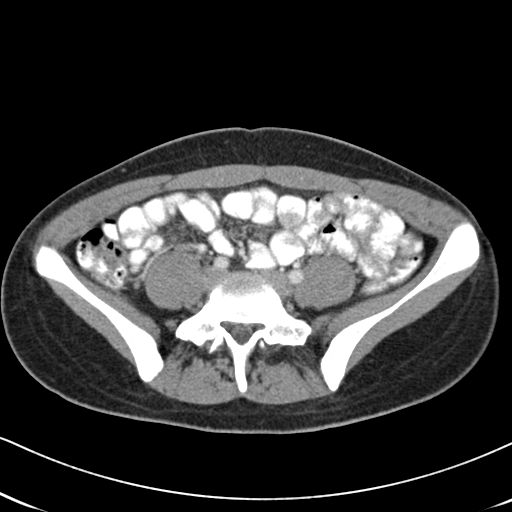
[im 79/151  soft-tissue]
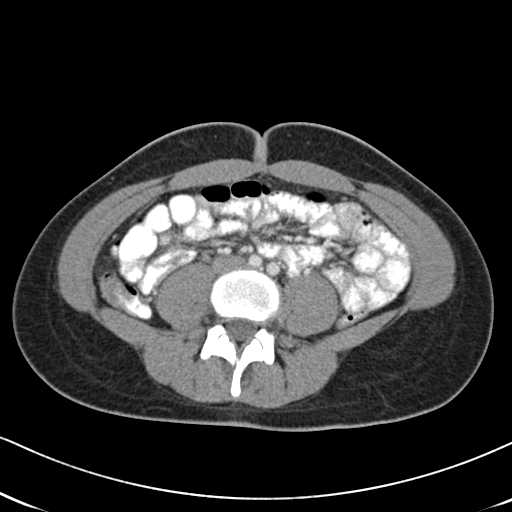
[im 85/151  soft-tissue]
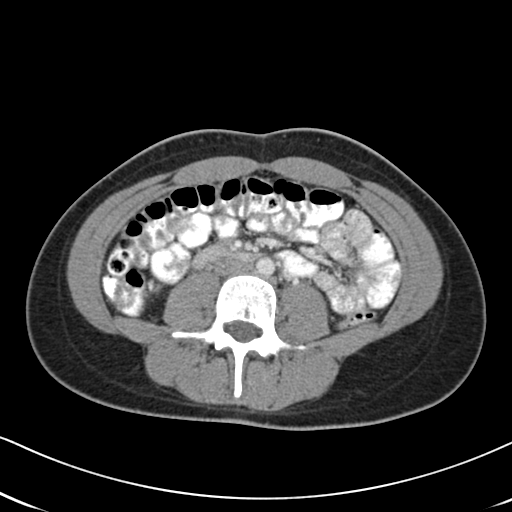
[im 97/151  soft-tissue]
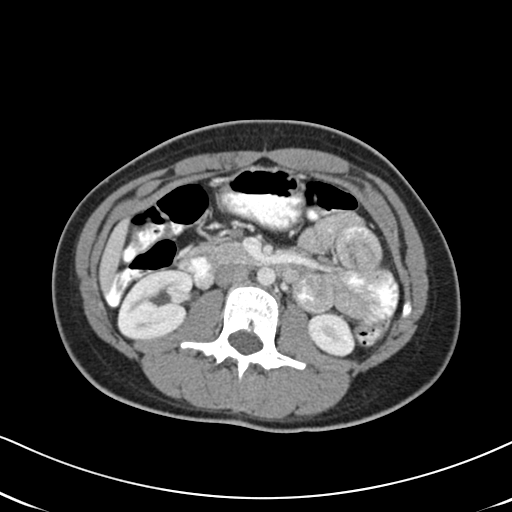
[im 97/151  bone]
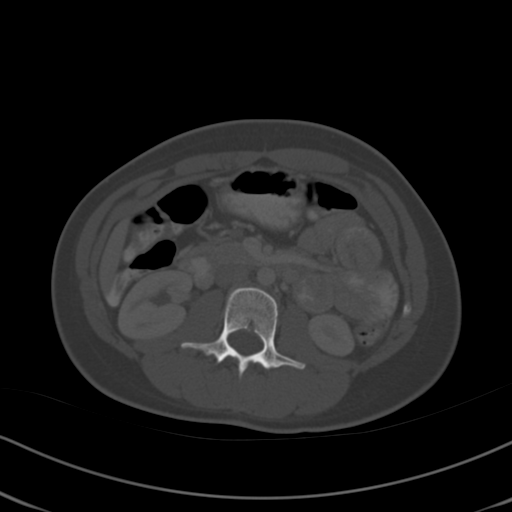
[im 109/151  soft-tissue]
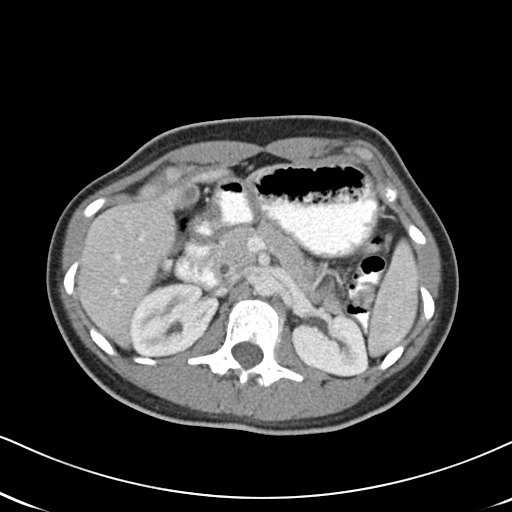
[im 121/151  soft-tissue]
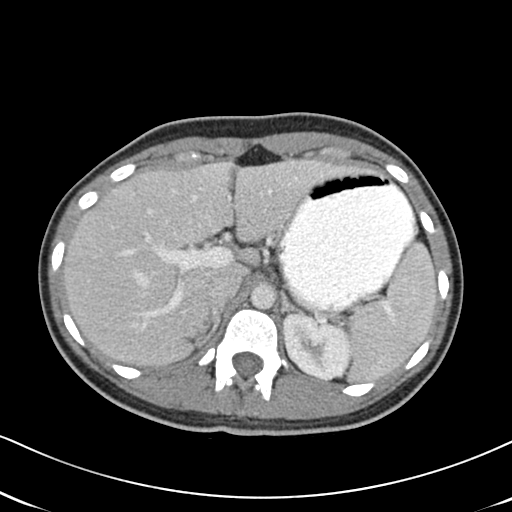
[im 127/151  lung]
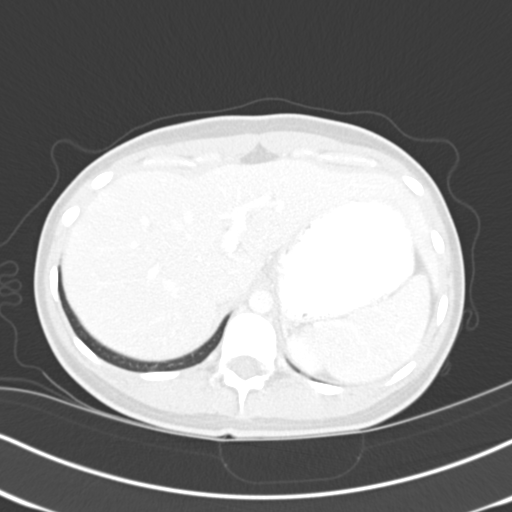
[im 133/151  soft-tissue]
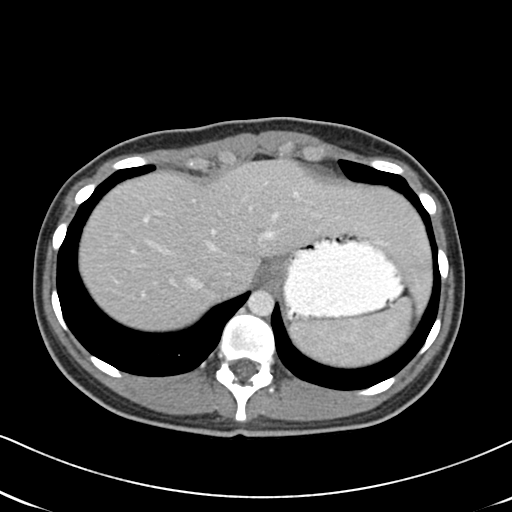
[im 133/151  lung]
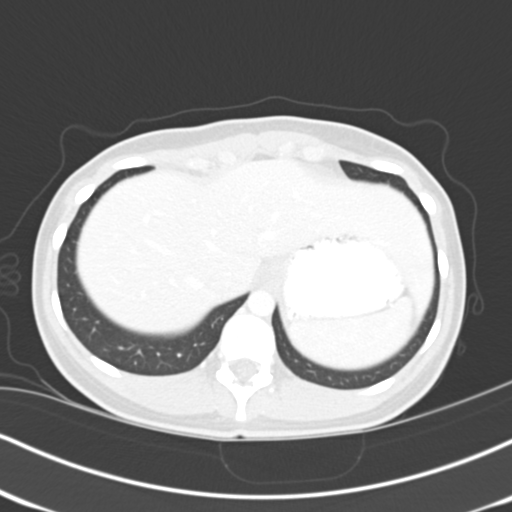
[im 139/151  lung]
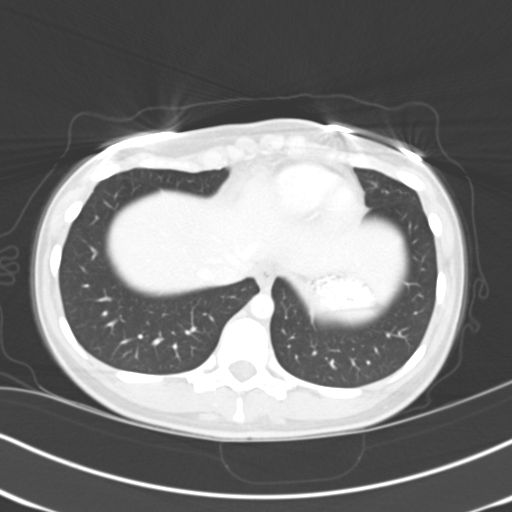
[im 145/151  soft-tissue]
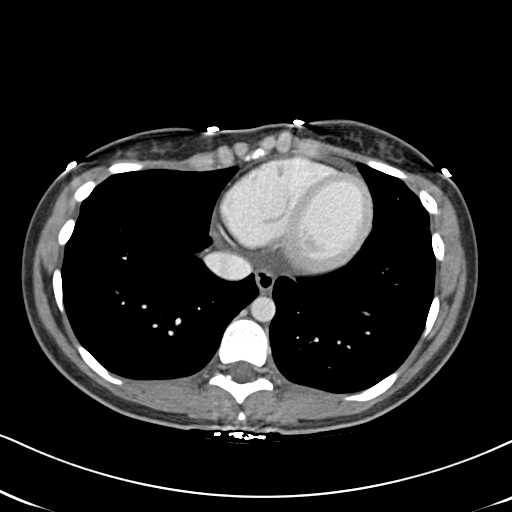
[im 145/151  lung]
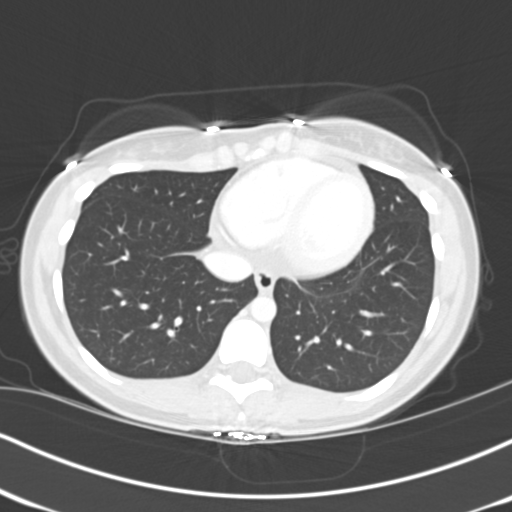

[15 of 32 positions shown; findings below may reference images not displayed]

FINDINGS: The liver, gallbladder, spleen, adrenals, and pancreas are unremarkable. The
kidneys enhance normally. The appendix is normal. There is a trace amount of
free fluid in the pelvis, which is likely physiologic. The small and large
bowel are normal in caliber. There is a small, short segment small bowel
small bowel intussusception in the left hemi-abdomen. No evidence of bowel
junction. No associated mass identified.

No aggressive lytic or sclerotic osseous lesions are identified.
IMPRESSION: 1. Normal appendix.
2. There is a short segment small bowel small bowel intussusception in the
left hemiabdomen, without evidence of obstruction. This can be an incidental
finding. Clinical correlation is recommended.

## 2014-06-28 ENCOUNTER — Ambulatory Visit (HOSPITAL_COMMUNITY): Payer: BLUE CROSS/BLUE SHIELD | Attending: Cardiology | Admitting: Cardiology

## 2014-06-28 DIAGNOSIS — I471 Supraventricular tachycardia: Secondary | ICD-10-CM | POA: Diagnosis not present

## 2014-06-28 NOTE — Progress Notes (Signed)
Echo performed. 

## 2017-08-30 ENCOUNTER — Ambulatory Visit: Payer: Self-pay | Admitting: Obstetrics and Gynecology

## 2017-09-09 ENCOUNTER — Ambulatory Visit: Payer: Self-pay | Admitting: Obstetrics and Gynecology

## 2017-09-14 ENCOUNTER — Ambulatory Visit (INDEPENDENT_AMBULATORY_CARE_PROVIDER_SITE_OTHER): Payer: 59 | Admitting: Obstetrics and Gynecology

## 2017-09-14 ENCOUNTER — Other Ambulatory Visit: Payer: Self-pay

## 2017-09-14 VITALS — BP 100/60 | HR 70 | Ht 67.0 in | Wt 125.0 lb

## 2017-09-14 DIAGNOSIS — Z30011 Encounter for initial prescription of contraceptive pills: Secondary | ICD-10-CM

## 2017-09-14 DIAGNOSIS — Z124 Encounter for screening for malignant neoplasm of cervix: Secondary | ICD-10-CM

## 2017-09-14 DIAGNOSIS — Z113 Encounter for screening for infections with a predominantly sexual mode of transmission: Secondary | ICD-10-CM

## 2017-09-14 DIAGNOSIS — Z01419 Encounter for gynecological examination (general) (routine) without abnormal findings: Secondary | ICD-10-CM | POA: Diagnosis not present

## 2017-09-14 DIAGNOSIS — Z1339 Encounter for screening examination for other mental health and behavioral disorders: Secondary | ICD-10-CM

## 2017-09-14 DIAGNOSIS — Z0000 Encounter for general adult medical examination without abnormal findings: Secondary | ICD-10-CM

## 2017-09-14 DIAGNOSIS — Z1331 Encounter for screening for depression: Secondary | ICD-10-CM

## 2017-09-14 LAB — HM PAP SMEAR: HM Pap smear: NORMAL

## 2017-09-14 MED ORDER — NORGESTIMATE-ETH ESTRADIOL 0.25-35 MG-MCG PO TABS: 1.0000 | ORAL_TABLET | Freq: Every day | ORAL | 4 refills | Status: DC

## 2017-09-14 NOTE — Progress Notes (Signed)
Gynecology Annual Exam  PCP: Chrys Racer, MD  Chief Complaint  Patient presents with  . Gynecologic Exam    No complaints    History of Present Illness:  Ms. Yolanda Kim is a 22 y.o. G0P0000 who LMP was Patient's last menstrual period was 09/11/2017., presents today for her annual examination.  Her menses are regular every 28-30 days, lasting 5 day(s).  Dysmenorrhea none. She does not have intermenstrual bleeding.  She is not sexually active.  Last Pap: never had  Hx of STDs: none  There is a FH of breast cancer in her MGM. There is no FH of ovarian cancer. The patient does do self-breast exams.  Tobacco use: The patient denies current or previous tobacco use. Alcohol use: social drinker Exercise: moderately active  The patient wears seatbelts: yes.   The patient reports that domestic violence in her life is absent.   Patient is a 22 y.o. G0P0000 presenting for contraception consult.  She is currently on nothing for contraception and desiring to start OCP (estrogen/progesterone).  She has a past medical history significant for no contraindication to estrogen.  She specifically denies a history of migraine with aura, chronic hypertension, history of DVT/PE and smoking.  Reported Patient's last menstrual period was 09/11/2017.Marland Kitchen    Past Medical History:  Diagnosis Date  . 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 PAC (premature atrial contraction)   . Palpitations   . PVC's (premature ventricular contractions)   . Syncope and collapse   . Vasovagal syncope    Past Surgical History:  Procedure Laterality Date  . NO PAST SURGERIES     Medications   Medication Sig Start Date End Date Taking? Authorizing Provider  fexofenadine (ALLEGRA) 180 MG tablet Take 180 mg by mouth daily.    Yes [provider]  fluticasone (FLONASE) 50 MCG/ACT nasal spray Place 1 spray into the nose daily.    Yes [provider]   Allergies  Allergen Reactions  .  Cephalosporins Other (See Comments)   Obstetric History: G0P0000  Social History   Socioeconomic History  . Marital status: Single    Spouse name: Not on file  . Number of children: Not on file  . Years of education: Not on file  . Highest education level: Not on file  Occupational History  . Not on file  Social Needs  . Financial resource strain: Not on file  . Food insecurity:    Worry: Not on file    Inability: Not on file  . Transportation needs:    Medical: Not on file    Non-medical: Not on file  Tobacco Use  . Smoking status: Never Smoker  . Smokeless tobacco: Never Used  Substance and Sexual Activity  . Alcohol use: Yes    Alcohol/week: 1.2 oz    Types: 2 Glasses of wine per week    Comment: socially.  . Drug use: No  . Sexual activity: Never    Birth control/protection: None  Lifestyle  . Physical activity:    Days per week: 2 days    Minutes per session: 50 min  . Stress: Not on file  Relationships  . Social connections:    Talks on phone: Not on file    Gets together: Not on file    Attends religious service: Not on file    Active member of club or organization: Not on file    Attends meetings of clubs or organizations: Not on file  Relationship status: Not on file  . Intimate partner violence:    Fear of current or ex partner: Not on file    Emotionally abused: Not on file    Physically abused: Not on file    Forced sexual activity: Not on file  Other Topics Concern  . Not on file  Social History Narrative  . Not on file    Family History  Problem Relation Age of Onset  . Arrhythmia Father        A-Fib  . Hypertension Father   . Hyperlipidemia Father     Review of Systems  Constitutional: Negative.   HENT: Negative.   Eyes: Negative.   Respiratory: Negative.   Cardiovascular: Negative.   Gastrointestinal: Negative.   Genitourinary: Negative.   Musculoskeletal: Negative.   Skin: Negative.   Neurological: Negative.     Psychiatric/Behavioral: Negative.     Physical Exam BP 100/60 (BP Location: Left Arm, Patient Position: Sitting, Cuff Size: Normal)   Pulse 70   Ht 5\' 7"  (1.702 m)   Wt 125 lb (56.7 kg)   LMP 09/11/2017   BMI 19.58 kg/m    Physical Exam  Constitutional: She is oriented to person, place, and time. She appears well-developed and well-nourished. No distress.  Genitourinary: Uterus normal. Pelvic exam was performed with patient supine. There is no rash, tenderness, lesion or injury on the right labia. There is no rash, tenderness, lesion or injury on the left labia. There is bleeding (scant old blood in vault) in the vagina. No erythema or tenderness in the vagina. No signs of injury around the vagina. No vaginal discharge found. Right adnexum does not display mass, does not display tenderness and does not display fullness. Left adnexum does not display mass, does not display tenderness and does not display fullness. Cervix does not exhibit motion tenderness, lesion, discharge or polyp.   Uterus is mobile and anteverted. Uterus is not enlarged, tender or exhibiting a mass.  HENT:  Head: Normocephalic and atraumatic.  Eyes: EOM are normal. No scleral icterus.  Neck: Normal range of motion. Neck supple. No thyromegaly present.  Cardiovascular: Normal rate and regular rhythm. Exam reveals no gallop and no friction rub.  No murmur heard. Pulmonary/Chest: Effort normal and breath sounds normal. No respiratory distress. She has no wheezes. She has no rales. Right breast exhibits no inverted nipple, no mass, no nipple discharge, no skin change and no tenderness. Left breast exhibits no inverted nipple, no mass, no nipple discharge, no skin change and no tenderness.  Abdominal: Soft. Bowel sounds are normal. She exhibits no distension and no mass. There is no tenderness. There is no rebound and no guarding.  Musculoskeletal: Normal range of motion. She exhibits no edema or tenderness.   Lymphadenopathy:    She has no cervical adenopathy.       Right: No inguinal adenopathy present.       Left: No inguinal adenopathy present.  Neurological: She is alert and oriented to person, place, and time. No cranial nerve deficit.  Skin: Skin is warm and dry. No rash noted. No erythema.  Psychiatric: She has a normal mood and affect. Her behavior is normal. Judgment normal.   Female chaperone present for pelvic and breast  portions of the physical exam  Results: AUDIT Questionnaire (screen for alcoholism): 4 PHQ-9: 0  Assessment: 22 y.o. G0P0000 female here for routine annual gynecologic examination  Plan: Problem List Items Addressed This Visit    None    Visit Diagnoses  Women's annual routine gynecological examination    -  Primary   Relevant Orders   IGP, CtNg, rfx Aptima HPV ASCU   Screening for depression       Screening for alcoholism       Pap smear for cervical cancer screening       Relevant Orders   IGP, CtNg, rfx Aptima HPV ASCU   Screen for STD (sexually transmitted disease)       Relevant Orders   IGP, CtNg, rfx Aptima HPV ASCU   Encounter for initial prescription of contraceptive pills       Relevant Medications   norgestimate-ethinyl estradiol (SPRINTEC 28) 0.25-35 MG-MCG tablet     Screening: -- Blood pressure screen normal -- Weight screening: normal -- Depression screening negative (PHQ-9) -- Nutrition: normal -- cholesterol screening: not due for screening -- osteoporosis screening: not due -- tobacco screening: not using -- alcohol screening: AUDIT questionnaire indicates low-risk usage. -- family history of breast cancer screening: done. not at high risk. -- no evidence of domestic violence or intimate partner violence. -- STD screening: gonorrhea/chlamydia NAAT collected -- pap smear collected per ASCCP guidelines -- HPV vaccination series: Will discuss at next visit.  Contraceptive management: Reviewed all forms of birth control  options available including abstinence; over the counter/barrier methods; hormonal contraceptive medication including pill, patch, ring, injection,contraceptive implant; hormonal and nonhormonal IUDs; Risks and benefits reviewed.  Questions were answered.  She would like to try combined OCPs.   Thomasene MohairStephen Nikki Rusnak, MD 09/14/2017 2:22 PM

## 2017-09-24 LAB — IGP, CTNG, RFX APTIMA HPV ASCU
CHLAMYDIA, NUC. ACID AMP: NEGATIVE
GONOCOCCUS BY NUCLEIC ACID AMP: NEGATIVE
PAP Smear Comment: 0

## 2017-09-29 ENCOUNTER — Encounter: Payer: Self-pay | Admitting: Obstetrics and Gynecology

## 2018-09-19 ENCOUNTER — Other Ambulatory Visit: Payer: Self-pay

## 2018-09-19 DIAGNOSIS — Z30011 Encounter for initial prescription of contraceptive pills: Secondary | ICD-10-CM

## 2018-09-19 MED ORDER — NORGESTIMATE-ETH ESTRADIOL 0.25-35 MG-MCG PO TABS: 1.0000 | ORAL_TABLET | Freq: Every day | ORAL | 0 refills | Status: DC

## 2018-09-19 NOTE — Telephone Encounter (Signed)
Pt needs an annual exam this month

## 2018-10-06 ENCOUNTER — Other Ambulatory Visit (HOSPITAL_COMMUNITY)
Admission: RE | Admit: 2018-10-06 | Discharge: 2018-10-06 | Disposition: A | Discharge status: Home / Self Care | Payer: 59 | Source: Ambulatory Visit | Attending: Obstetrics and Gynecology | Admitting: Obstetrics and Gynecology

## 2018-10-06 ENCOUNTER — Other Ambulatory Visit: Payer: Self-pay

## 2018-10-06 ENCOUNTER — Ambulatory Visit (INDEPENDENT_AMBULATORY_CARE_PROVIDER_SITE_OTHER): Payer: 59 | Admitting: Obstetrics and Gynecology

## 2018-10-06 ENCOUNTER — Encounter: Payer: Self-pay | Admitting: Obstetrics and Gynecology

## 2018-10-06 VITALS — BP 112/74 | Ht 67.0 in | Wt 141.0 lb

## 2018-10-06 DIAGNOSIS — Z01419 Encounter for gynecological examination (general) (routine) without abnormal findings: Secondary | ICD-10-CM | POA: Insufficient documentation

## 2018-10-06 DIAGNOSIS — Z30011 Encounter for initial prescription of contraceptive pills: Secondary | ICD-10-CM

## 2018-10-06 DIAGNOSIS — Z1339 Encounter for screening examination for other mental health and behavioral disorders: Secondary | ICD-10-CM

## 2018-10-06 DIAGNOSIS — Z113 Encounter for screening for infections with a predominantly sexual mode of transmission: Secondary | ICD-10-CM

## 2018-10-06 DIAGNOSIS — Z1331 Encounter for screening for depression: Secondary | ICD-10-CM

## 2018-10-06 DIAGNOSIS — Z3041 Encounter for surveillance of contraceptive pills: Secondary | ICD-10-CM

## 2018-10-06 MED ORDER — NORGESTIMATE-ETH ESTRADIOL 0.25-35 MG-MCG PO TABS: 1.0000 | ORAL_TABLET | Freq: Every day | ORAL | 4 refills | Status: DC

## 2018-10-06 NOTE — Progress Notes (Signed)
Gynecology Annual Exam  PCP: Luna Fuse, MD  Chief Complaint  Patient presents with  . Annual Exam   History of Present Illness:  Ms. Yolanda Kim is a 23 y.o. G0P0000 who LMP was Patient's last menstrual period was 09/22/2018., presents today for her annual examination.  Her menses are regular every 28-30 days, lasting 6 day(s).  Dysmenorrhea none. She does not have intermenstrual bleeding.  She is sexually active.  No problems with intercourse.  Uses combined OCPs.  Last Pap: 1 year  Results were: no abnormalities /neg HPV DNA not done Hx of STDs: none  There is no FH of breast cancer. There is no FH of ovarian cancer. The patient does do self-breast exams.  Tobacco use: The patient denies current or previous tobacco use. Alcohol use: social drinker Exercise: very active  The patient wears seatbelts: yes.   The patient reports that domestic violence in her life is absent.   Past Medical History:  Diagnosis Date  . 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 PAC (premature atrial contraction)   . Palpitations   . PVC's (premature ventricular contractions)   . Syncope and collapse   . Vasovagal syncope     Past Surgical History:  Procedure Laterality Date  . NO PAST SURGERIES      Prior to Admission medications   Medication Sig Start Date End Date Taking? Authorizing Provider  fexofenadine (ALLEGRA) 180 MG tablet Take 180 mg by mouth daily.    Yes [provider]  fluticasone (FLONASE) 50 MCG/ACT nasal spray Place 1 spray into the nose daily.    Yes [provider]  norgestimate-ethinyl estradiol ([place] 28) 0.25-35 MG-MCG tablet Take 1 tablet by mouth daily. 09/19/18 12/12/18 Yes Will Bonnet, MD    Allergies  Allergen Reactions  . Cephalosporins Other (See Comments)   Obstetric History: G0P0000  Social History   Socioeconomic History  . Marital status: Single    Spouse name: Not on file  . Number of children: Not on file  . Years of  education: Not on file  . Highest education level: Not on file  Occupational History  . Not on file  Social Needs  . Financial resource strain: Not on file  . Food insecurity    Worry: Not on file    Inability: Not on file  . Transportation needs    Medical: Not on file    Non-medical: Not on file  Tobacco Use  . Smoking status: Never Smoker  . Smokeless tobacco: Never Used  Substance and Sexual Activity  . Alcohol use: Yes    Alcohol/week: 2.0 standard drinks    Types: 2 Glasses of wine per week    Comment: socially.  . Drug use: No  . Sexual activity: Yes    Birth control/protection: Pill  Lifestyle  . Physical activity    Days per week: 2 days    Minutes per session: 50 min  . Stress: Not on file  Relationships  . Social Herbalist on phone: Not on file    Gets together: Not on file    Attends religious service: Not on file    Active member of club or organization: Not on file    Attends meetings of clubs or organizations: Not on file    Relationship status: Not on file  . Intimate partner violence    Fear of current or ex partner: Not on file    Emotionally abused:  Not on file    Physically abused: Not on file    Forced sexual activity: Not on file  Other Topics Concern  . Not on file  Social History Narrative  . Not on file    Family History  Problem Relation Age of Onset  . Arrhythmia Father        A-Fib  . Hypertension Father   . Hyperlipidemia Father     Review of Systems  Constitutional: Negative.   HENT: Negative.   Eyes: Negative.   Respiratory: Negative.   Cardiovascular: Negative.   Gastrointestinal: Negative.   Genitourinary: Negative.   Musculoskeletal: Negative.   Skin: Negative.   Neurological: Negative.   Psychiatric/Behavioral: Negative.      Physical Exam BP 112/74   Ht 5\' 7"  (1.702 m)   Wt 141 lb (64 kg)   LMP 09/22/2018   BMI 22.08 kg/m    Physical Exam Constitutional:      General: She is not in acute  distress.    Appearance: Normal appearance. She is well-developed.  Genitourinary:     Pelvic exam was performed with patient in the lithotomy position.     Vulva, urethra, bladder and uterus normal.     No inguinal adenopathy present in the right or left side.    No signs of injury in the vagina.     No vaginal discharge, erythema, tenderness or bleeding.     No cervical motion tenderness, discharge, lesion or polyp.     Uterus is mobile.     Uterus is not enlarged or tender.     No uterine mass detected.    Uterus is anteverted.     No right or left adnexal mass present.     Right adnexa not tender or full.     Left adnexa not tender or full.  HENT:     Head: Normocephalic and atraumatic.  Eyes:     General: No scleral icterus.    Conjunctiva/sclera: Conjunctivae normal.  Neck:     Musculoskeletal: Normal range of motion and neck supple.     Thyroid: No thyromegaly.  Cardiovascular:     Rate and Rhythm: Normal rate and regular rhythm.     Heart sounds: No murmur. No friction rub. No gallop.   Pulmonary:     Effort: Pulmonary effort is normal. No respiratory distress.     Breath sounds: Normal breath sounds. No wheezing or rales.  Chest:     Breasts:        Right: No inverted nipple, mass, nipple discharge, skin change or tenderness.        Left: No inverted nipple, mass, nipple discharge, skin change or tenderness.  Abdominal:     General: Bowel sounds are normal. There is no distension.     Palpations: Abdomen is soft. There is no mass.     Tenderness: There is no abdominal tenderness. There is no guarding or rebound.  Musculoskeletal: Normal range of motion.        General: No swelling or tenderness.  Lymphadenopathy:     Cervical: No cervical adenopathy.     Lower Body: No right inguinal adenopathy. No left inguinal adenopathy.  Neurological:     General: No focal deficit present.     Mental Status: She is alert and oriented to person, place, and time.     Cranial  Nerves: No cranial nerve deficit.  Skin:    General: Skin is warm and dry.     Findings: No  erythema or rash.  Psychiatric:        Mood and Affect: Mood normal.        Behavior: Behavior normal.        Judgment: Judgment normal.     Female chaperone present for pelvic and breast  portions of the physical exam  Results: AUDIT Questionnaire (screen for alcoholism): 4 PHQ-9: 0   Assessment: 23 y.o. G0P0000 female here for routine annual gynecologic examination  Plan: Problem List Items Addressed This Visit    None    Visit Diagnoses    Women's annual routine gynecological examination    -  Primary   Relevant Medications   norgestimate-ethinyl estradiol (SPRINTEC 28) 0.25-35 MG-MCG tablet   Other Relevant Orders   Cervicovaginal ancillary only   Screening for depression       Screening for alcoholism       Screen for STD (sexually transmitted disease)       Relevant Orders   Cervicovaginal ancillary only   Encounter for surveillance of contraceptive pills       Relevant Medications   norgestimate-ethinyl estradiol (SPRINTEC 28) 0.25-35 MG-MCG tablet   Encounter for initial prescription of contraceptive pills          Screening: -- Blood pressure screen normal -- Weight screening: normal -- Depression screening negative (PHQ-9) -- Nutrition: normal -- cholesterol screening: not due for screening -- osteoporosis screening: not due -- tobacco screening: not using -- alcohol screening: AUDIT questionnaire indicates low-risk usage. -- family history of breast cancer screening: done. not at high risk. -- no evidence of domestic violence or intimate partner violence. -- STD screening: gonorrhea/chlamydia NAAT collected -- pap smear not collected per ASCCP guidelines  Thomasene MohairStephen Tamakia Porto, MD 10/06/2018 4:33 PM

## 2018-10-10 LAB — CERVICOVAGINAL ANCILLARY ONLY
Chlamydia: NEGATIVE
Neisseria Gonorrhea: NEGATIVE

## 2019-08-16 ENCOUNTER — Other Ambulatory Visit: Payer: Self-pay

## 2019-08-16 DIAGNOSIS — Z3041 Encounter for surveillance of contraceptive pills: Secondary | ICD-10-CM

## 2019-08-16 DIAGNOSIS — Z01419 Encounter for gynecological examination (general) (routine) without abnormal findings: Secondary | ICD-10-CM

## 2019-08-16 MED ORDER — NORGESTIMATE-ETH ESTRADIOL 0.25-35 MG-MCG PO TABS: 1.0000 | ORAL_TABLET | Freq: Every day | ORAL | 0 refills | Status: AC

## 2019-10-08 ENCOUNTER — Other Ambulatory Visit: Payer: Self-pay

## 2019-10-08 DIAGNOSIS — Z01419 Encounter for gynecological examination (general) (routine) without abnormal findings: Secondary | ICD-10-CM

## 2019-10-08 DIAGNOSIS — Z3041 Encounter for surveillance of contraceptive pills: Secondary | ICD-10-CM

## 2019-10-10 ENCOUNTER — Ambulatory Visit: Payer: 59 | Admitting: Obstetrics and Gynecology

## 2021-07-16 ENCOUNTER — Other Ambulatory Visit: Payer: 59
